# Patient Record
Sex: Male | Born: 1985 | Race: Black or African American | Hispanic: No | Marital: Single | Smoking: Current every day smoker
Health system: Southern US, Community
[De-identification: ages and names within clinical notes are randomized; demographics above are authoritative.]

## PROBLEM LIST (undated history)

## (undated) HISTORY — PX: HAND SURGERY: SHX662

---

## 2012-11-16 ENCOUNTER — Ambulatory Visit
Admission: RE | Admit: 2012-11-16 | Discharge: 2012-11-16 | Disposition: A | Discharge status: Home / Self Care | Payer: BC Managed Care – PPO | Source: Ambulatory Visit | Attending: Orthopedic Surgery | Admitting: Orthopedic Surgery

## 2012-11-16 ENCOUNTER — Other Ambulatory Visit: Payer: Self-pay | Admitting: Orthopedic Surgery

## 2012-11-16 DIAGNOSIS — W3400XA Accidental discharge from unspecified firearms or gun, initial encounter: Secondary | ICD-10-CM

## 2014-07-22 ENCOUNTER — Emergency Department (HOSPITAL_COMMUNITY): Payer: Self-pay

## 2014-07-22 ENCOUNTER — Emergency Department (HOSPITAL_COMMUNITY)
Admission: EM | Admit: 2014-07-22 | Discharge: 2014-07-22 | Disposition: A | Discharge status: Home / Self Care | Payer: Self-pay | Attending: Emergency Medicine | Admitting: Emergency Medicine

## 2014-07-22 ENCOUNTER — Encounter (HOSPITAL_COMMUNITY): Payer: Self-pay | Admitting: *Deleted

## 2014-07-22 DIAGNOSIS — S62502A Fracture of unspecified phalanx of left thumb, initial encounter for closed fracture: Secondary | ICD-10-CM

## 2014-07-22 DIAGNOSIS — Y9367 Activity, basketball: Secondary | ICD-10-CM | POA: Insufficient documentation

## 2014-07-22 DIAGNOSIS — Y9289 Other specified places as the place of occurrence of the external cause: Secondary | ICD-10-CM | POA: Insufficient documentation

## 2014-07-22 DIAGNOSIS — W2105XA Struck by basketball, initial encounter: Secondary | ICD-10-CM | POA: Insufficient documentation

## 2014-07-22 DIAGNOSIS — S62525A Nondisplaced fracture of distal phalanx of left thumb, initial encounter for closed fracture: Secondary | ICD-10-CM | POA: Insufficient documentation

## 2014-07-22 DIAGNOSIS — T1490XA Injury, unspecified, initial encounter: Secondary | ICD-10-CM

## 2014-07-22 DIAGNOSIS — Y998 Other external cause status: Secondary | ICD-10-CM | POA: Insufficient documentation

## 2014-07-22 DIAGNOSIS — Z720 Tobacco use: Secondary | ICD-10-CM | POA: Insufficient documentation

## 2014-07-22 MED ORDER — HYDROCODONE-ACETAMINOPHEN 5-325 MG PO TABS: 2.0000 | ORAL_TABLET | ORAL | Status: DC | PRN

## 2014-07-22 NOTE — ED Provider Notes (Signed)
CSN: [id number]     Arrival date & time 07/22/14  [id number] History   First MD Initiated Contact with Patient 07/22/14 [phone]     Chief Complaint  Patient presents with  . Hand Injury     (Consider location/radiation/quality/duration/timing/severity/associated sxs/prior Treatment) HPI Comments: Patient presents to the ER for evaluation of hand injury. Patient reports injury to the left thumb while playing basketball yesterday. He reports that he jammed his thumb and it was bent backwards. He has been having pain at the base of the thumb ever since. Pain is moderate, worsens with movement. No wrist pain.   Patient is a 29 y.o. male presenting with hand injury.  Hand Injury   History reviewed. No pertinent past medical history. Past Surgical History  Procedure Laterality Date  . Hand surgery     History reviewed. No pertinent family history. History  Substance Use Topics  . Smoking status: Current Every Day Smoker -- 0.50 packs/day    Types: Cigarettes  . Smokeless tobacco: Not on file  . Alcohol Use: Yes    Review of Systems  Musculoskeletal: Positive for arthralgias.      Allergies  Review of patient's allergies indicates no known allergies.  Home Medications   Prior to Admission medications   Not on File   BP 127/76 mmHg  Pulse 67  Temp(Src) 97.9 F (36.6 C) (Oral)  Resp 16  Ht 6' (1.829 m)  Wt 165 lb (74.844 kg)  BMI 22.37 kg/m2  SpO2 99% Physical Exam  Musculoskeletal:       Left wrist: Normal. He exhibits no tenderness.       Left hand: He exhibits tenderness and swelling. He exhibits normal capillary refill, no deformity and no laceration. Normal sensation noted. Normal strength noted.       Hands: Ulnar and radial collateral ligaments intact left thumb  Neurological: He is alert. He has normal strength. No cranial nerve deficit or sensory deficit.  Skin: Skin is warm, dry and intact.    ED Course  Procedures (including critical care time) Labs  Review Labs Reviewed - No data to display  Imaging Review Dg Finger Thumb Left  07/22/2014   CLINICAL DATA:  Injury while playing basketball.  Pain  EXAM: LEFT THUMB 2+V  COMPARISON:  None.  FINDINGS: Frontal, oblique, and lateral views were obtained. There is an incomplete nondisplaced fracture through the proximal metaphysis of the first distal phalanx. No other fracture. No dislocation. Joint spaces appear intact.  IMPRESSION: Incomplete nondisplaced fracture, proximal aspect first distal phalanx.   Electronically Signed   By: Bretta BangWilliam  Woodruff M.D.   On: 07/22/2014 07:38     EKG Interpretation None      MDM   Final diagnoses:  Injury  Thumb fracture, left, closed, initial encounter   Patient suffered injury to left thumb yesterday while playing basketball. Patient has pain and swelling at the MCP joint and the IP joint of the thumb. No deformity noted. X-ray confirms a proximal fracture of distal phalanx. No perceived collateral ligament injury, but still need to consider. Patient placed in thumb spica, will follow up with hand surgery.   Gilda Creasehristopher J. Pollina, MD 07/22/14 [phone]

## 2014-07-22 NOTE — ED Notes (Signed)
Pt reports injuring his left thumb yesterday while playing basketball - pt noted progressively worse edema and pain to the digit.

## 2014-07-22 NOTE — Discharge Instructions (Signed)
Thumb Fracture  °There are many types of thumb fractures (breaks). There are different ways of treating these fractures, all of which may be correct, varying from case to case. Your caregiver will discuss different ways to treat these fractures with you. °TREATMENT  °· Immobilization. This means the fracture is casted as it is without changing the positions of the fracture (bone pieces) involved. This fracture is casted in a "thumb spica" also called a hitchhiker cast. It is generally left on for 2 to 6 weeks. °· Closed reduction. The bones are manipulated back into position without using surgery. °· ORIF (open reduction and internal fixation). The fracture site is opened and the bone pieces are fixed into place with some type of hardware such as screws or wires. °Your caregiver will discuss the type of fracture you have and the treatment that will be best for that problem. If surgery is the treatment of choice, the following is information for you to know and to let your caregiver know about prior to surgery. °LET YOUR CAREGIVERS KNOW ABOUT: °· Allergies. °· Medications taken including herbs, eye drops, over the counter medications, and creams. °· Use of steroids (by mouth or creams). °· Previous problems with anesthetics or Novocain. °· Family history of anesthetic complications.. °· Possibility of pregnancy, if this applies. °· History of blood clots (thrombophlebitis). °· History of bleeding or blood problems. °· Previous surgery. °· Other health problems. °AFTER THE PROCEDURE  °After surgery, you will be taken to the recovery area. A nurse will watch and check your progress. Once you are awake, stable, and taking fluids well, barring other problems you will be allowed to go home. Once home, an ice pack applied to your operative site may help with discomfort and keep the swelling down. Elevate your hand above your heart as much as possible for the first 4-5 days after the injury/surgery. °HOME CARE INSTRUCTIONS    °· Follow your caregiver's instructions as to activities, exercises, physical therapy, and driving a car. °· Use thumb and exercise as directed. °· Only take over-the-counter or prescription medicines for pain, discomfort, or fever as directed by your caregiver. Do not take aspirin until your caregiver instructs. This can increase bleeding immediately following surgery. °SEEK MEDICAL CARE IF:  °· There is increased bleeding (more than a small spot) from the wound or from beneath your cast or splint. °· There is redness, swelling, or increasing pain in the wound or from beneath your cast or splint. °· You have pus coming from wound or from beneath your cast or splint. °· An unexplained oral temperature above 102° F (38.9° C) develops. °· There is a foul smell coming from the wound or dressing or from beneath your cast or splint. °SEEK IMMEDIATE MEDICAL CARE IF:  °· You develop severe pain, decreased sensation such as numbness or tingling. °· You develop a rash. °· You have difficulty breathing. °· Youhave any allergic problems. °If you do not have a window in your cast for observing the wound, a discharge or minor bleeding may show up as a stain on the outside of your cast. Report these findings to your caregiver. If you have a removable splint overlying the surgical dressings it is common to see a small amount of bleeding. Change the dressings as instructed by your caregiver. °Document Released: 03/19/2003 Document Revised: 09/12/2011 Document Reviewed: 08/23/2013 °ExitCare® Patient Information ©2015 [employer]. This information is not intended to replace advice given to you by your health care provider. Make sure   you discuss any questions you have with your health care provider. ° °

## 2014-07-22 NOTE — ED Notes (Signed)
Ortho called 

## 2014-07-22 NOTE — ED Notes (Signed)
Pt alert and oriented x4. Respirations even and unlabored, bilateral symmetrical rise and fall of chest. Skin warm and dry. In no acute distress. Denies needs.   

## 2014-07-22 NOTE — ED Notes (Signed)
Pt escorted to discharge window. Pt verbalized understanding discharge instructions. In no acute distress.  

## 2014-08-02 ENCOUNTER — Emergency Department (HOSPITAL_COMMUNITY): Payer: Self-pay

## 2014-08-02 ENCOUNTER — Encounter (HOSPITAL_COMMUNITY): Payer: Self-pay | Admitting: *Deleted

## 2014-08-02 ENCOUNTER — Emergency Department (HOSPITAL_COMMUNITY)
Admission: EM | Admit: 2014-08-02 | Discharge: 2014-08-02 | Disposition: A | Discharge status: Home / Self Care | Payer: Self-pay | Attending: Emergency Medicine | Admitting: Emergency Medicine

## 2014-08-02 DIAGNOSIS — Y92310 Basketball court as the place of occurrence of the external cause: Secondary | ICD-10-CM | POA: Insufficient documentation

## 2014-08-02 DIAGNOSIS — Y9367 Activity, basketball: Secondary | ICD-10-CM | POA: Insufficient documentation

## 2014-08-02 DIAGNOSIS — Y998 Other external cause status: Secondary | ICD-10-CM | POA: Insufficient documentation

## 2014-08-02 DIAGNOSIS — S6992XA Unspecified injury of left wrist, hand and finger(s), initial encounter: Secondary | ICD-10-CM | POA: Insufficient documentation

## 2014-08-02 DIAGNOSIS — X58XXXA Exposure to other specified factors, initial encounter: Secondary | ICD-10-CM | POA: Insufficient documentation

## 2014-08-02 MED ORDER — IBUPROFEN 600 MG PO TABS: 600.0000 mg | ORAL_TABLET | Freq: Four times a day (QID) | ORAL | Status: DC | PRN

## 2014-08-02 NOTE — ED Provider Notes (Signed)
Dr. Amanda PeaGramig has come to see patient in the ED to f/u on questionable thumb fracture. Pt seen by [name], [name], [name]  No results found for this or any previous visit. Dg Finger Thumb Left  08/02/2014   CLINICAL DATA:  Injury while playing basketball 12 days prior  EXAM: LEFT THUMB 2+V  COMPARISON:  July 22, 2014  FINDINGS: Frontal, oblique, and lateral views were obtained. The lucency in the proximal aspect of the first distal phalanx is unchanged. There is no appreciable callus in this area. No new lucency is identified. No other evidence of potential fracture. No dislocation. Joint spaces appear intact.  IMPRESSION: No appreciable change from prior study. Linear lucency in the proximal aspect of the first distal phalanx remains. This area is concerning for nondisplaced fracture. It should be noted that a prominent nutrient foramen is a differential consideration. No appreciable change is noted compared to recent prior study.   Electronically Signed   By: Bretta BangWilliam  Woodruff M.D.   On: 08/02/2014 17:13   Dg Finger Thumb Left  07/22/2014   CLINICAL DATA:  Injury while playing basketball.  Pain  EXAM: LEFT THUMB 2+V  COMPARISON:  None.  FINDINGS: Frontal, oblique, and lateral views were obtained. There is an incomplete nondisplaced fracture through the proximal metaphysis of the first distal phalanx. No other fracture. No dislocation. Joint spaces appear intact.  IMPRESSION: Incomplete nondisplaced fracture, proximal aspect first distal phalanx.   Electronically Signed   By: Bretta BangWilliam  Woodruff M.D.   On: 07/22/2014 07:38     Dr. Amanda PeaGramig recommends patient call the office for recheck of your visit in the ER today in 4-6 weeks if your pain persists. Pt has a splint at home that he wants pt to use instead of ER splint and to take ibuprofen. -- pt finger is sprained and not fractured.  [name]'s evaluation in the Emergency Department is complete.  It has been determined that no acute conditions requiring further emergency intervention are present at this time. The patient/guardian have been advised of the diagnosis and plan. We have discussed signs and symptoms that warrant return to the ED, such as changes or worsening in symptoms.  Vital signs are stable at discharge. Filed Vitals:   08/02/14 1506  BP: 121/70  Pulse: 86  Temp: 97.3 F (36.3 C)  Resp: 18    Patient/guardian has voiced understanding and agreed to follow-up with the PCP or specialist.    Dorthula Matasiffany G Micky Sheller, PA-C 08/02/14 1741  Samuel JesterKathleen McManus, DO 08/04/14 1358

## 2014-08-02 NOTE — ED Notes (Signed)
The pt has a short arm cast  For a ?? Thumb fracture.  He talked to dr graming 3 days ago and he was told to meet him today.  Pain.  The cast is verty dirty but intact

## 2014-08-02 NOTE — ED Notes (Signed)
Declined W/C at D/C and was escorted to lobby by RN. 

## 2014-08-02 NOTE — Discharge Instructions (Signed)
USE SPLINT AT HOME.Finger Sprain A finger sprain is a tear in one of the strong, fibrous tissues that connect the bones (ligaments) in your finger. The severity of the sprain depends on how much of the ligament is torn. The tear can be either partial or complete. CAUSES  Often, sprains are a result of a fall or accident. If you extend your hands to catch an object or to protect yourself, the force of the impact causes the fibers of your ligament to stretch too much. This excess tension causes the fibers of your ligament to tear. SYMPTOMS  You may have some loss of motion in your finger. Other symptoms include:  Bruising.  Tenderness.  Swelling. DIAGNOSIS  In order to diagnose finger sprain, your caregiver will physically examine your finger or thumb to determine how torn the ligament is. Your caregiver may also suggest an X-ray exam of your finger to make sure no bones are broken. TREATMENT  If your ligament is only partially torn, treatment usually involves keeping the finger in a fixed position (immobilization) for a short period. To do this, your caregiver will apply a bandage, cast, or splint to keep your finger from moving until it heals. For a partially torn ligament, the healing process usually takes 2 to 3 weeks. If your ligament is completely torn, you may need surgery to reconnect the ligament to the bone. After surgery a cast or splint will be applied and will need to stay on your finger or thumb for 4 to 6 weeks while your ligament heals. HOME CARE INSTRUCTIONS  Keep your injured finger elevated, when possible, to decrease swelling.  To ease pain and swelling, apply ice to your joint twice a day, for 2 to 3 days:  Put ice in a plastic bag.  Place a towel between your skin and the bag.  Leave the ice on for 15 minutes.  Only take over-the-counter or prescription medicine for pain as directed by your caregiver.  Do not wear rings on your injured finger.  Do not leave your  finger unprotected until pain and stiffness go away (usually 3 to 4 weeks).  Do not allow your cast or splint to get wet. Cover your cast or splint with a plastic bag when you shower or bathe. Do not swim.  Your caregiver may suggest special exercises for you to do during your recovery to prevent or limit permanent stiffness. SEEK IMMEDIATE MEDICAL CARE IF:  Your cast or splint becomes damaged.  Your pain becomes worse rather than better. MAKE SURE YOU:  Understand these instructions.  Will watch your condition.  Will get help right away if you are not doing well or get worse. Document Released: 07/28/2004 Document Revised: 09/12/2011 Document Reviewed: 02/21/2011 [facility]Exit[facility] Patient Information 2015 [place], [place]. This information is not intended to replace advice given to you by your health care provider. Make sure you discuss any questions you have with your health care provider.

## 2014-08-03 NOTE — Consult Note (Signed)
NAMEMarland Kitchen  TIJUAN, DANTES NO.:  [id number]  MEDICAL RECORD NO.:  [id number]  LOCATION:  TR07C                        FACILITY:  [facility]  PHYSICIAN:  Dionne Ano. Tysen Roesler, M.D.DATE OF BIRTH:  10/18/1985  DATE OF CONSULTATION: DATE OF DISCHARGE:  08/02/2014                                CONSULTATION   Joshua Franklin was seen in the [facility] Emergency Room at the request of the ER staff.  Joshua Franklin is a 29 year old male who has undergone an injury while playing basketball in mid January.  Joshua Franklin was seen on July 22, 2014.  Joshua Franklin presents for evaluation today on August 02, 2014.  Joshua Franklin states his left thumb has been tender over the MCP joint. Previously, Radiology read out a possible linear lucency in the distal phalanx.  I should note that this is nontender to the patient.  We have updated new x-rays today.  Past medical and surgical history were reviewed.  Joshua Franklin has history of right hand reconstruction by myself, it appears back in 2014, with limited followup.  The patient is a smoker and does drink alcohol.  Joshua Franklin notes no other medical problems.  Joshua Franklin denies locking, popping, catching in the wrist.  I reviewed all issues with him in detail and the findings.  At present juncture, the patient does not demonstrate any obvious instability, infection, dystrophy or vascular compromise in the left thumb.  The left thumb MCP joint is tender radially and up at the MCP joint, there is a notable sprain in my opinion to the MCP joint, but no evidence of fracture.  His IP joint is completely nontender as is the distal phalanx.  PHYSICAL EXAMINATION:  Chest is clear.  HEENT is within normal limits. Abdomen is nontender.  Joshua Franklin walks with nonantalgic gait.  Joshua Franklin is appropriate.  His right hand has excellent healed wounds without complicating features.  I reviewed this with Erinn at length.  X-rays do show excellent-appearing radiographs.  The images are of good quality.  Joshua Franklin does not in my  opinion have distal phalanx fracture.  This is a linear lucency only and I should note that having the opportunity to examine the patient is quite helpful.  I will copy a note to Dr. Cristela Felt of Radiology.  I would note that this is likely a vessel versus simple and anatomic undulation in the bone that is only suspicious radiographically, but on correlative objective examination, it is not a fracture pattern process.  I have discussed these issues with Joshua Franklin.  IMPRESSION:  Status post left thumb metacarpophalangeal sprain with mild radial collateral ligament injury without instability on provocative exam.  PLAN:  Rest, oral anti-inflammatories.  I would not recommend narcotic pain medicine.  I have discussed with him the relevant issues, do's and don'ts.  Joshua Franklin has a brace, which Joshua Franklin will use.  Joshua Franklin will come out of it for gentle motion, no stressing until the joint is nontender.  I have discussed with him it can take 3-6 months for this to resolve.  If it is not improved in 4-6 weeks, I want to see him back in the office.  We have discussed these issues at great length and went over the do's and don'ts,  etc.  Should any problems, questions or concerns arise, I will certainly be available for the patient and Joshua Franklin understands this.  Do's and don'ts have been discussed.  All questions were encouraged and answered.     Dionne AnoWilliam M. Amanda PeaGramig, M.D.     [facility] LLCWMG/MEDQ  D:  08/02/2014  T:  08/03/2014  Job:  [id number]  cc:   Dr. Cristela Feltuff

## 2015-11-20 ENCOUNTER — Emergency Department (HOSPITAL_BASED_OUTPATIENT_CLINIC_OR_DEPARTMENT_OTHER)
Admission: EM | Admit: 2015-11-20 | Discharge: 2015-11-20 | Disposition: A | Discharge status: Home / Self Care | Payer: Managed Care, Other (non HMO) | Attending: Emergency Medicine | Admitting: Emergency Medicine

## 2015-11-20 ENCOUNTER — Encounter (HOSPITAL_BASED_OUTPATIENT_CLINIC_OR_DEPARTMENT_OTHER): Payer: Self-pay | Admitting: *Deleted

## 2015-11-20 DIAGNOSIS — K0889 Other specified disorders of teeth and supporting structures: Secondary | ICD-10-CM | POA: Diagnosis not present

## 2015-11-20 DIAGNOSIS — F17210 Nicotine dependence, cigarettes, uncomplicated: Secondary | ICD-10-CM | POA: Diagnosis not present

## 2015-11-20 MED ORDER — KETOROLAC TROMETHAMINE 60 MG/2ML IM SOLN
60.0000 mg | Freq: Once | INTRAMUSCULAR | Status: AC
  Administered 2015-11-20: 60 mg via INTRAMUSCULAR
  Filled 2015-11-20: qty 2

## 2015-11-20 MED ORDER — PENICILLIN V POTASSIUM 250 MG PO TABS
500.0000 mg | ORAL_TABLET | Freq: Once | ORAL | Status: AC
  Administered 2015-11-20: 500 mg via ORAL
  Filled 2015-11-20: qty 2

## 2015-11-20 MED ORDER — PENICILLIN V POTASSIUM 500 MG PO TABS: 500.0000 mg | ORAL_TABLET | Freq: Four times a day (QID) | ORAL | Status: AC

## 2015-11-20 NOTE — Discharge Instructions (Signed)
Dental Pain Mr. Joshua Franklin, take antibiotics as directed.  Use tylenol and ibuprofen at home for pain control.  See a dentist within 3 days for close follow up. If symptoms worsen, come back to the ED immediately. Thank you.  Dental pain may be caused by many things, including:  Tooth decay (cavities or caries). Cavities cause the nerve of your tooth to be open to air and hot or cold temperatures. This can cause pain or discomfort.  Abscess or infection. A dental abscess is an area that is full of infected pus from a bacterial infection in the inner part of the tooth (pulp). It usually happens at the end of the tooth's root.  Injury.  An unknown reason (idiopathic). Your pain may be mild or severe. It may only happen when:  You are chewing.  You are exposed to hot or cold temperature.  You are eating or drinking sugary foods or beverages, such as:  Soda.  Candy. Your pain may also be there all of the time. HOME CARE Watch your dental pain for any changes. Do these things to lessen your discomfort:  Take medicines only as told by your dentist.  If your dentist tells you to take an antibiotic medicine, finish all of it even if you start to feel better.  Keep all follow-up visits as told by your dentist. This is important.  Do not apply heat to the outside of your face.  Rinse your mouth or gargle with salt water if told by your dentist. This helps with pain and swelling.  You can make salt water by adding  tsp of salt to 1 cup of warm water.  Apply ice to the painful area of your face:  Put ice in a plastic bag.  Place a towel between your skin and the bag.  Leave the ice on for 20 minutes, 2-3 times per day.  Avoid foods or drinks that cause you pain, such as:  Very hot or very cold foods or drinks.  Sweet or sugary foods or drinks. GET HELP IF:  Your pain is not helped with medicines.  Your symptoms are worse.  You have new symptoms. GET HELP RIGHT AWAY  IF:  You cannot open your mouth.  You are having trouble breathing or swallowing.  You have a fever.  Your face, neck, or jaw is puffy (swollen).   This information is not intended to replace advice given to you by your health care provider. Make sure you discuss any questions you have with your health care provider.   Document Released: 12/07/2007 Document Revised: 11/04/2014 Document Reviewed: 06/16/2014 Elsevier Interactive Patient Education [employer]2016 Elsevier Inc.

## 2015-11-20 NOTE — ED Provider Notes (Addendum)
CSN: [id number]     Arrival date & time 11/20/15  0044 History   First MD Initiated Contact with Patient 11/20/15 0117     Chief Complaint  Patient presents with  . Dental Pain     (Consider location/radiation/quality/duration/timing/severity/associated sxs/prior Treatment) HPI   Joshua Franklin is a 30 y.o. male with no second past medical history presenting today for dental pain. Patient describes pain in the right lower side of his mouth. He states he's had problems in the past and has had to get his teeth pulled. He had an appointment but missed it and needs to reschedule it. He has not taken anything for pain control. He states he feels his neck is swollen as well. He denies any fevers. There are no further complaints.  10 Systems reviewed and are negative for acute change except as noted in the HPI.     History reviewed. No pertinent past medical history. Past Surgical History  Procedure Laterality Date  . Hand surgery     History reviewed. No pertinent family history. Social History  Substance Use Topics  . Smoking status: Current Every Day Smoker -- 0.50 packs/day    Types: Cigarettes  . Smokeless tobacco: None  . Alcohol Use: Yes    Review of Systems    Allergies  Review of patient's allergies indicates no known allergies.  Home Medications   Prior to Admission medications   Medication Sig Start Date End Date Taking? Authorizing Provider  penicillin v potassium (VEETID) 500 MG tablet Take 1 tablet (500 mg total) by mouth 4 (four) times daily. 11/20/15   Tomasita Crumble, MD   BP 123/90 mmHg  Pulse 71  Temp(Src) 98 F (36.7 C) (Oral)  Resp 18  Ht 6' (1.829 m)  Wt 165 lb (74.844 kg)  BMI 22.37 kg/m2  SpO2 100% Physical Exam  Constitutional: He is oriented to person, place, and time. Vital signs are normal. He appears well-developed and well-nourished.  Non-toxic appearance. He does not appear ill. No distress.  HENT:  Head: Normocephalic and atraumatic.   Nose: Nose normal.  Mouth/Throat: Oropharynx is clear and moist. No oropharyngeal exudate.  Overall poor dentition with multiple caries and fillings. Tooth #47 and 45 are cracked and there is mild swelling around the base. There is no abscess formation. There is tenderness to palpation.  Eyes: Conjunctivae and EOM are normal. Pupils are equal, round, and reactive to light. No scleral icterus.  Neck: Normal range of motion. Neck supple. No tracheal deviation, no edema, no erythema and normal range of motion present. No thyroid mass and no thyromegaly present.  Cardiovascular: Normal rate, regular rhythm, S1 normal, S2 normal, normal heart sounds, intact distal pulses and normal pulses.  Exam reveals no gallop and no friction rub.   No murmur heard. Pulmonary/Chest: Effort normal and breath sounds normal. No respiratory distress. He has no wheezes. He has no rhonchi. He has no rales.  Abdominal: Soft. Normal appearance and bowel sounds are normal. He exhibits no distension, no ascites and no mass. There is no hepatosplenomegaly. There is no tenderness. There is no rebound, no guarding and no CVA tenderness.  Musculoskeletal: Normal range of motion. He exhibits no edema or tenderness.  Lymphadenopathy:    He has no cervical adenopathy.  Neurological: He is alert and oriented to person, place, and time. He has normal strength. No cranial nerve deficit or sensory deficit.  Skin: Skin is warm, dry and intact. No petechiae and no rash noted. He is not  diaphoretic. No erythema. No pallor.  Psychiatric: He has a normal mood and affect. His behavior is normal. Judgment normal.  Nursing note and vitals reviewed.   ED Course  Procedures (including critical care time) Labs Review Labs Reviewed - No data to display  Imaging Review No results found. I have personally reviewed and evaluated these images and lab results as part of my medical decision-making.   EKG Interpretation None      MDM    Final diagnoses:  Pain, dental    Patient presents emergency department for dental pain. He appears to have an infection surrounding his painful tooth. He was given penicillin as well as IM injection of Toradol in the emergency department. We'll discharge with penicillin prescription. Advised on Tylenol and ibuprofen at home. Dental follow-up provided. He demonstrates understanding with the need to have this evaluated by a dentist and pulled. He appears well in no acute distress, vital signs were within his normal limits and he is safe for discharge.    Tomasita CrumbleAdeleke Alyanna Stoermer, MD 11/20/15 [id number]  Tomasita CrumbleAdeleke Claudis Giovanelli, MD 11/20/15 [id number]

## 2015-11-20 NOTE — ED Notes (Signed)
C/o R lower dental pain, h/o same, "was suppose to get it pulled, but missed appointment, has new dental cards, needs to re-schedule", pain re-occurred yesterday, worse last night, no meds PTA, c/o pain, some swelling, some drainage, some dizziness (denies: nv, fever, bleeding), rates 10/10.

## 2015-11-20 NOTE — ED Notes (Signed)
Dr. Oni into room. 

## 2016-02-29 IMAGING — CR DG FINGER THUMB 2+V*L*
3 series · 3 of 3 positions shown · non-contrast
Comparison: July 22, 2014

CLINICAL DATA: Injury while playing basketball 12 days prior

EXAM:
LEFT THUMB 2+V

[x finger obl left]
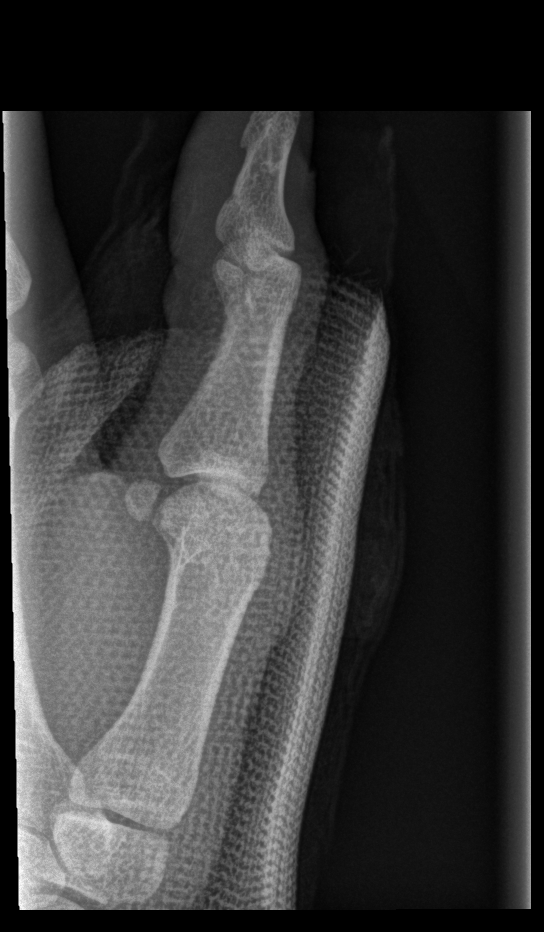

[x finger lat left]
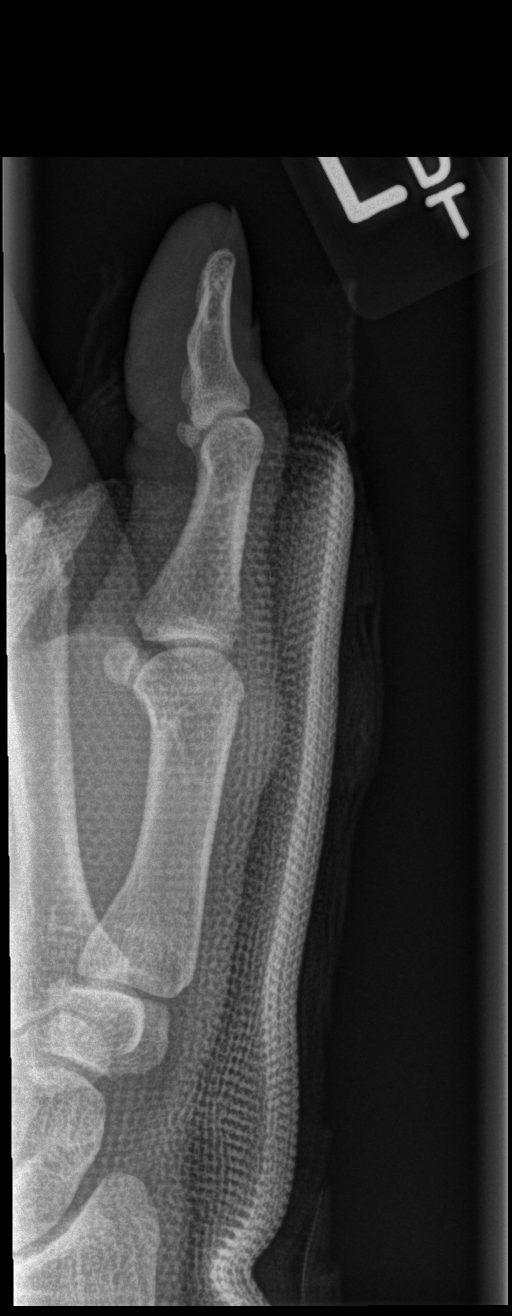

[x finger pa left]
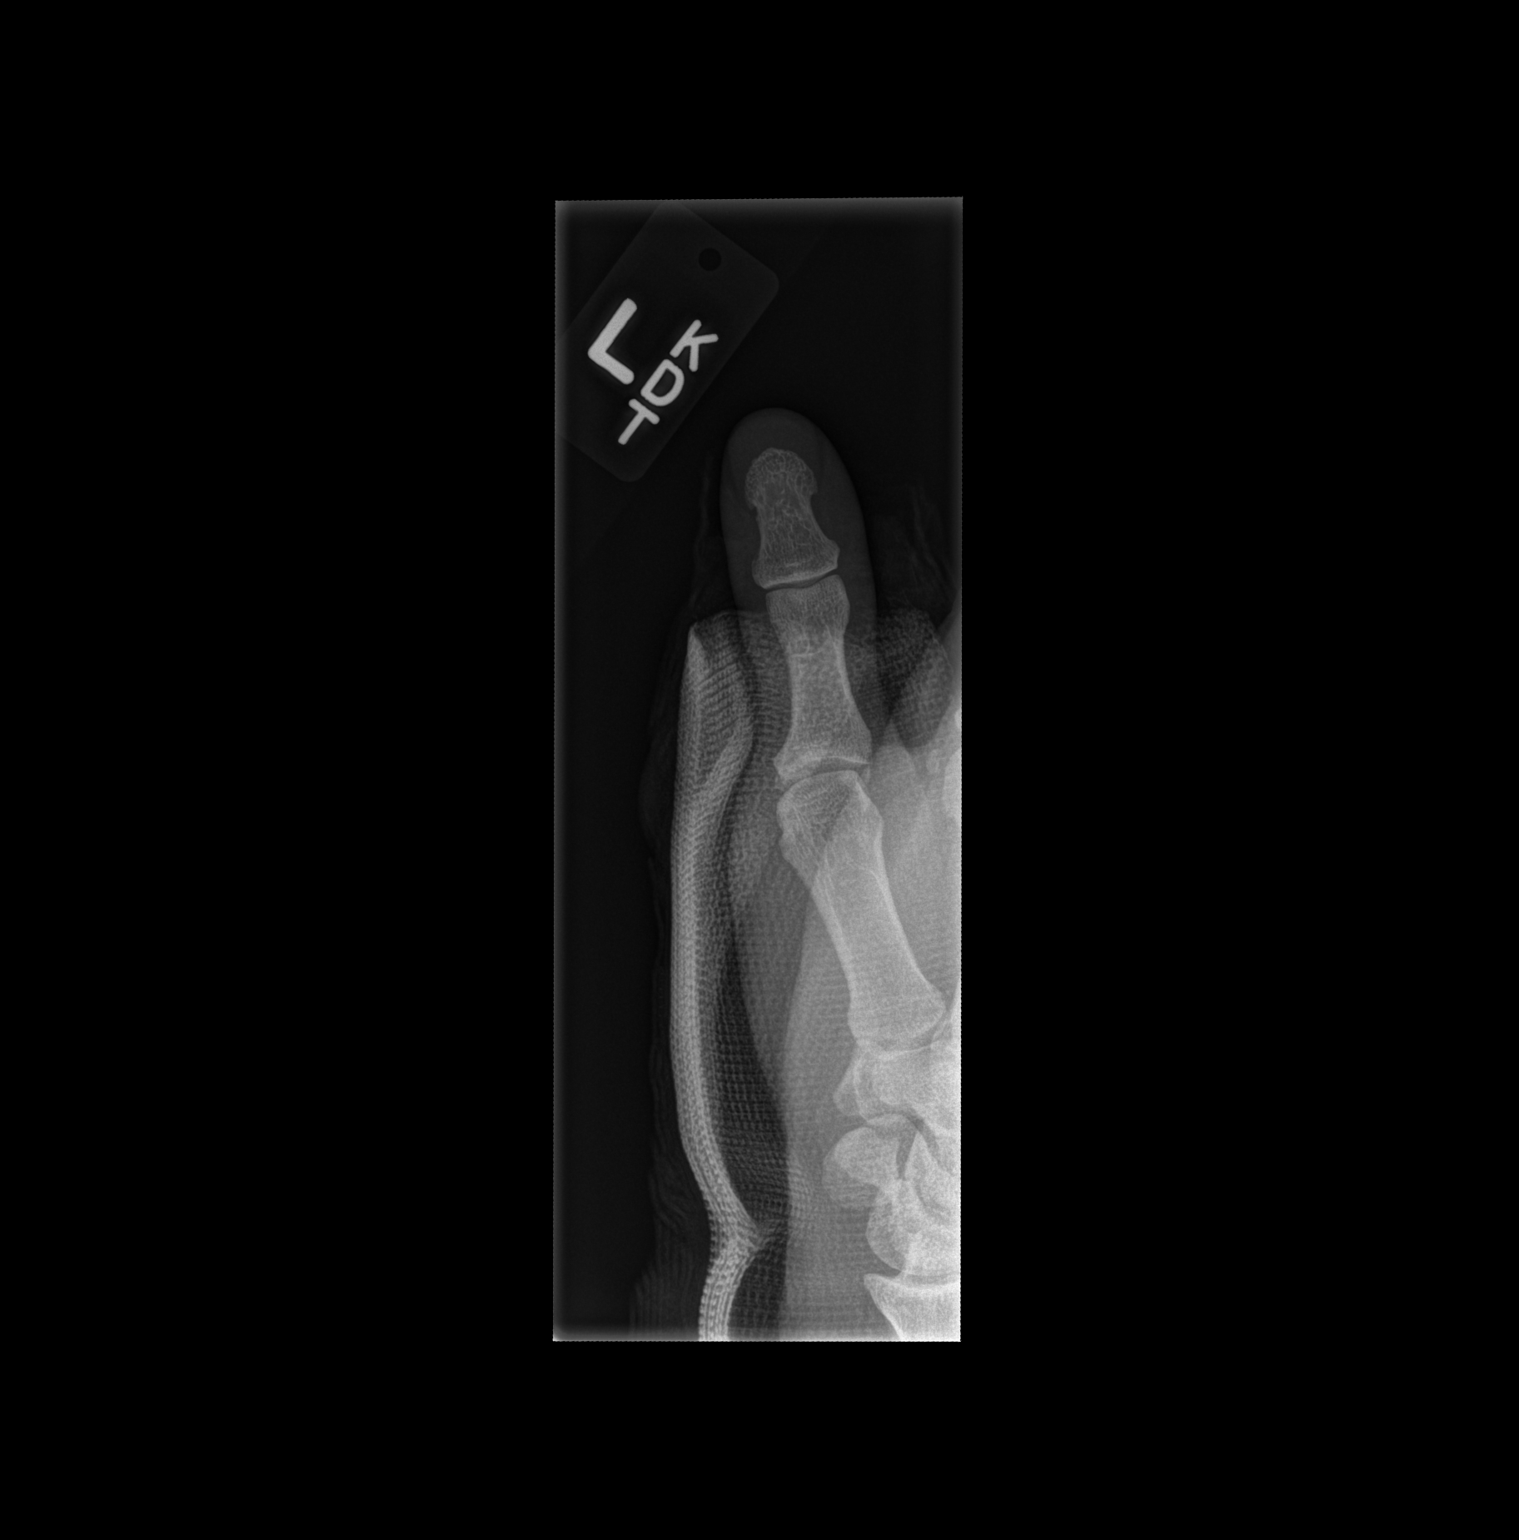

[3 of 3 positions shown; findings below may reference images not displayed]

FINDINGS: Frontal, oblique, and lateral views were obtained. The lucency in
the proximal aspect of the first distal phalanx is unchanged. There
is no appreciable callus in this area. No new lucency is identified.
No other evidence of potential fracture. No dislocation. Joint
spaces appear intact.
IMPRESSION: No appreciable change from prior study. Linear lucency in the
proximal aspect of the first distal phalanx remains. This area is
concerning for nondisplaced fracture. It should be noted that a
prominent nutrient foramen is a differential consideration. No
appreciable change is noted compared to recent prior study.

## 2018-07-18 ENCOUNTER — Encounter (HOSPITAL_COMMUNITY): Payer: Self-pay | Admitting: Emergency Medicine

## 2018-07-18 ENCOUNTER — Other Ambulatory Visit: Payer: Self-pay

## 2018-07-18 ENCOUNTER — Emergency Department (HOSPITAL_COMMUNITY)
Admission: EM | Admit: 2018-07-18 | Discharge: 2018-07-19 | Disposition: A | Discharge status: Home / Self Care | Payer: Managed Care, Other (non HMO) | Attending: Emergency Medicine | Admitting: Emergency Medicine

## 2018-07-18 DIAGNOSIS — R197 Diarrhea, unspecified: Secondary | ICD-10-CM | POA: Insufficient documentation

## 2018-07-18 DIAGNOSIS — F17210 Nicotine dependence, cigarettes, uncomplicated: Secondary | ICD-10-CM | POA: Insufficient documentation

## 2018-07-18 LAB — CBC
HCT: 50.4 % (ref 39.0–52.0)
Hemoglobin: 16.2 g/dL (ref 13.0–17.0)
MCH: 30.8 pg (ref 26.0–34.0)
MCHC: 32.1 g/dL (ref 30.0–36.0)
MCV: 95.8 fL (ref 80.0–100.0)
NRBC: 0 % (ref 0.0–0.2)
PLATELETS: 193 10*3/uL (ref 150–400)
RBC: 5.26 MIL/uL (ref 4.22–5.81)
RDW: 14.3 % (ref 11.5–15.5)
WBC: 6.3 10*3/uL (ref 4.0–10.5)

## 2018-07-18 LAB — URINALYSIS, ROUTINE W REFLEX MICROSCOPIC
Bilirubin Urine: NEGATIVE
Glucose, UA: NEGATIVE mg/dL
Hgb urine dipstick: NEGATIVE
KETONES UR: NEGATIVE mg/dL
LEUKOCYTES UA: NEGATIVE
Nitrite: NEGATIVE
Protein, ur: NEGATIVE mg/dL
Specific Gravity, Urine: 1.017 (ref 1.005–1.030)
pH: 7 (ref 5.0–8.0)

## 2018-07-18 LAB — COMPREHENSIVE METABOLIC PANEL
ALK PHOS: 50 U/L (ref 38–126)
ALT: 19 U/L (ref 0–44)
AST: 20 U/L (ref 15–41)
Albumin: 4.5 g/dL (ref 3.5–5.0)
Anion gap: 6 (ref 5–15)
BUN: 7 mg/dL (ref 6–20)
CALCIUM: 9 mg/dL (ref 8.9–10.3)
CO2: 28 mmol/L (ref 22–32)
CREATININE: 0.77 mg/dL (ref 0.61–1.24)
Chloride: 107 mmol/L (ref 98–111)
GFR calc Af Amer: >60 mL/min (ref >60)
Glucose, Bld: 73 mg/dL (ref 70–99)
Potassium: 3.7 mmol/L (ref 3.5–5.1)
Sodium: 141 mmol/L (ref 135–145)
Total Bilirubin: 0.8 mg/dL (ref 0.3–1.2)
Total Protein: 6.8 g/dL (ref 6.5–8.1)

## 2018-07-18 LAB — LIPASE, BLOOD: Lipase: 58 U/L — ABNORMAL HIGH (ref 11–51)

## 2018-07-18 MED ORDER — SODIUM CHLORIDE 0.9% FLUSH: 3.0000 mL | Freq: Once | INTRAVENOUS | Status: DC

## 2018-07-18 MED ORDER — LOPERAMIDE HCL 2 MG PO CAPS
4.0000 mg | ORAL_CAPSULE | Freq: Once | ORAL | Status: AC
  Administered 2018-07-18: 4 mg via ORAL
  Filled 2018-07-18: qty 2

## 2018-07-18 MED ORDER — LOPERAMIDE HCL 2 MG PO CAPS: 2.0000 mg | ORAL_CAPSULE | Freq: Four times a day (QID) | ORAL | 0 refills | Status: AC | PRN

## 2018-07-18 NOTE — Discharge Instructions (Signed)
You were seen in the ED today with diarrhea. Return to the ED with any new or worsening symptoms.

## 2018-07-18 NOTE — ED Triage Notes (Signed)
Patient c/o N/V x2 days ago that has since resolved. C/o diarrhea today. Denies abdominal pain.

## 2018-07-18 NOTE — ED Provider Notes (Signed)
Emergency Department Provider Note   I have reviewed the triage vital signs and the nursing notes.   HISTORY  Chief Complaint Diarrhea   HPI Joshua Franklin is a 33 y.o. male with no significant PMH presents to the emergency department for evaluation of diarrhea for the past 3 days.  He initially had nausea and vomiting but those symptoms have resolved.  His diarrhea is watery and nonbloody.  He has not had recent travel or taken antibiotics.  Denies fevers or chills.  No abdominal pain, back pain, or UTI symptoms.  He states that 2 of his roommates have similar symptoms. No radiation of symptoms or modifying factors. He has been drinking water and Gatorade without difficulty.   History reviewed. No pertinent past medical history.  There are no active problems to display for this patient.   Past Surgical History:  Procedure Laterality Date  . HAND SURGERY     Allergies Patient has no known allergies.  No family history on file.  Social History Social History   Tobacco Use  . Smoking status: Current Every Day Smoker    Packs/day: 0.50    Types: Cigarettes  Substance Use Topics  . Alcohol use: Yes  . Drug use: No    Review of Systems  Constitutional: No fever/chills Eyes: No visual changes. ENT: No sore throat. Cardiovascular: Denies chest pain. Respiratory: Denies shortness of breath. Gastrointestinal: No abdominal pain.  No nausea, no vomiting. Positive diarrhea.  No constipation. Genitourinary: Negative for dysuria. Musculoskeletal: Negative for back pain. Skin: Negative for rash. Neurological: Negative for headaches, focal weakness or numbness.  10-point [name] otherwise negative.  ____________________________________________   PHYSICAL EXAM:  VITAL SIGNS: ED Triage Vitals  Enc Vitals Group     BP 07/18/18 1528 130/88     Pulse Rate 07/18/18 1528 64     Resp 07/18/18 1528 18     Temp 07/18/18 1528 98.2 F (36.8 C)     Temp Source 07/18/18 1528  Oral     SpO2 07/18/18 1528 100 %     Weight 07/18/18 1527 165 lb (74.8 kg)     Height 07/18/18 1527 6' (1.829 m)     Pain Score 07/18/18 1527 0   Constitutional: Alert and oriented. Well appearing and in no acute distress. Eyes: Conjunctivae are normal. Head: Atraumatic. Nose: No congestion/rhinnorhea. Mouth/Throat: Mucous membranes are moist.  Neck: No stridor.   Cardiovascular: Normal rate, regular rhythm. Good peripheral circulation. Grossly normal heart sounds.   Respiratory: Normal respiratory effort.  No retractions. Lungs CTAB. Gastrointestinal: Soft and nontender. No distention.  Musculoskeletal: No lower extremity tenderness nor edema. No gross deformities of extremities. Neurologic:  Normal speech and language. No gross focal neurologic deficits are appreciated.  Skin:  Skin is warm, dry and intact. No rash noted.  ____________________________________________   LABS (all labs ordered are listed, but only abnormal results are displayed)  Labs Reviewed  LIPASE, BLOOD - Abnormal; Notable for the following components:      Result Value   Lipase 58 (*)    All other components within normal limits  COMPREHENSIVE METABOLIC PANEL  CBC  URINALYSIS, ROUTINE W REFLEX MICROSCOPIC   ____________________________________________  RADIOLOGY  None ____________________________________________   PROCEDURES  Procedure(s) performed:   Procedures  None ____________________________________________   INITIAL IMPRESSION / ASSESSMENT AND PLAN / ED COURSE  Pertinent labs & imaging results that were available during my care of the patient were reviewed by me and considered in my medical decision making (  see chart for details).  Patient presents to the emergency department with diarrhea for the last several days. No vomiting. No blood in the stool or fever. Patient with no abdominal tenderness on exam. No historical features to suspect invasive diarrhea illness to benefit from  abx. Patient able to hydrate well at home. Appears well-hydrated here. Labs reviewed without acute electrolyte abnormality. Very mild lipase elevation but no tenderness or vomiting to suspect acute pancreatitis. UA negative for infection. No IVF at this time. Rx for imodium and continued PO hydration advised. Discussed PCP f/u plan and ED return precautions.    ____________________________________________  FINAL CLINICAL IMPRESSION(S) / ED DIAGNOSES  Final diagnoses:  Diarrhea of presumed infectious origin     MEDICATIONS GIVEN DURING THIS VISIT:  Medications  loperamide (IMODIUM) capsule 4 mg (4 mg Oral Given 07/18/18 2255)     NEW OUTPATIENT MEDICATIONS STARTED DURING THIS VISIT:  Discharge Medication List as of 07/18/2018 10:09 PM    START taking these medications   Details  loperamide (IMODIUM) 2 MG capsule Take 1 capsule (2 mg total) by mouth 4 (four) times daily as needed for diarrhea or loose stools., Starting Wed 07/18/2018, Print        Note:  This document was prepared using Dragon voice recognition software and may include unintentional dictation errors.  Alona Bene, MD Emergency Medicine    Idriss Quackenbush, Arlyss Repress, MD 07/19/18 1000

## 2019-07-22 ENCOUNTER — Ambulatory Visit: Payer: Self-pay | Attending: Internal Medicine

## 2019-07-22 ENCOUNTER — Other Ambulatory Visit: Payer: Self-pay

## 2019-07-22 DIAGNOSIS — Z20822 Contact with and (suspected) exposure to covid-19: Secondary | ICD-10-CM | POA: Insufficient documentation

## 2019-07-23 LAB — NOVEL CORONAVIRUS, NAA: SARS-CoV-2, NAA: NOT DETECTED

## 2024-01-06 ENCOUNTER — Ambulatory Visit: Payer: Self-pay

## 2024-03-18 ENCOUNTER — Other Ambulatory Visit: Payer: Self-pay

## 2024-03-18 DIAGNOSIS — Y92410 Unspecified street and highway as the place of occurrence of the external cause: Secondary | ICD-10-CM | POA: Insufficient documentation

## 2024-03-18 DIAGNOSIS — M25531 Pain in right wrist: Secondary | ICD-10-CM | POA: Insufficient documentation

## 2024-03-18 DIAGNOSIS — M25561 Pain in right knee: Secondary | ICD-10-CM | POA: Diagnosis not present

## 2024-03-18 DIAGNOSIS — M545 Low back pain, unspecified: Secondary | ICD-10-CM | POA: Diagnosis present
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
## 2024-03-19 ENCOUNTER — Emergency Department (HOSPITAL_BASED_OUTPATIENT_CLINIC_OR_DEPARTMENT_OTHER)
Admission: EM | Admit: 2024-03-19 | Discharge: 2024-03-19 | Disposition: A | Discharge status: Home / Self Care | Payer: Self-pay | Attending: Emergency Medicine | Admitting: Emergency Medicine

## 2024-03-19 ENCOUNTER — Emergency Department (HOSPITAL_BASED_OUTPATIENT_CLINIC_OR_DEPARTMENT_OTHER): Payer: Self-pay | Admitting: Radiology

## 2024-03-19 DIAGNOSIS — S8001XA Contusion of right knee, initial encounter: Secondary | ICD-10-CM

## 2024-03-19 DIAGNOSIS — S63501A Unspecified sprain of right wrist, initial encounter: Secondary | ICD-10-CM

## 2024-03-19 DIAGNOSIS — S39012A Strain of muscle, fascia and tendon of lower back, initial encounter: Secondary | ICD-10-CM

## 2024-03-19 NOTE — Discharge Instructions (Signed)
 Your x-rays showed no evidence for fracture or dislocation.  Take ibuprofen  600 mg every 6 hours as needed for pain.  Ice for 20 minutes every 2 hours while awake for the next 2 days.  Follow-up with primary doctor if not improving in the next week.
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
